# Patient Record
Sex: Male | Born: 1955 | Race: White | Hispanic: No | Marital: Married | Smoking: Never smoker
Health system: Southern US, Community
[De-identification: ages and names within clinical notes are randomized; demographics above are authoritative.]

## PROBLEM LIST (undated history)

## (undated) DIAGNOSIS — E785 Hyperlipidemia, unspecified: Secondary | ICD-10-CM

## (undated) DIAGNOSIS — I10 Essential (primary) hypertension: Secondary | ICD-10-CM

## (undated) HISTORY — PX: OTHER SURGICAL HISTORY: SHX169

---

## 2004-02-08 ENCOUNTER — Observation Stay (HOSPITAL_COMMUNITY): Admission: AD | Admit: 2004-02-08 | Discharge: 2004-02-09 | Payer: Self-pay | Admitting: General Surgery

## 2004-03-27 ENCOUNTER — Ambulatory Visit (HOSPITAL_COMMUNITY): Admission: RE | Admit: 2004-03-27 | Discharge: 2004-03-27 | Payer: Self-pay | Admitting: General Surgery

## 2004-03-27 ENCOUNTER — Ambulatory Visit (HOSPITAL_BASED_OUTPATIENT_CLINIC_OR_DEPARTMENT_OTHER): Admission: RE | Admit: 2004-03-27 | Discharge: 2004-03-27 | Payer: Self-pay | Admitting: General Surgery

## 2008-10-27 ENCOUNTER — Emergency Department (HOSPITAL_COMMUNITY): Admission: EM | Admit: 2008-10-27 | Discharge: 2008-10-27 | Payer: Self-pay | Admitting: Emergency Medicine

## 2010-12-13 NOTE — Op Note (Signed)
NAME:  Richard Schmidt, Richard Schmidt                      ACCOUNT NO.:  [id number]   MEDICAL RECORD NO.:  [id number]                   PATIENT TYPE:  AMB   LOCATION:  DAY                                  FACILITY:  [facility]   PHYSICIAN:  Angelia Mould. Derrell Lolling, M.D.             DATE OF BIRTH:  1956-01-15   DATE OF PROCEDURE:  02/08/2004  DATE OF DISCHARGE:                                 OPERATIVE REPORT   PREOPERATIVE DIAGNOSIS:  Perirectal abscess.   POSTOPERATIVE DIAGNOSIS:  1. Complex perirectal abscess.  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   SURGEON:  Angelia Mould. Derrell Lolling, M.D.   OPERATIVE INDICATIONS:  This is a 55 year old white male who presents with a  10-12 day history of perianal pain and swelling.  He was placed on  antibiotics by his primary care physician, but he did not get better.  He  saw Dr. Orson Slick in the office on July 12th, who performed incision and  drainage of an abscess on the left gluteal area posterior to the rectum and  drained a lot of purulent material.  The patient has been continued on  antibiotics, but he has continued to have pain and fever to 102.  He was  seen today and found to have cellulitis and induration in the right  posterior, left posterior, and left anterior positions and had a white count  of 18,000.  He is brought to the operating room for examination under  anesthesia and drainage of his presumed and completely drained perirectal  abscess.   OPERATIVE TECHNIQUE:  Following induction of general endotracheal  anesthesia, the patient was placed in a dorsal lithotomy position, and the  perianal area was prepped and draped in a sterile fashion.   I first performed rigid proctoscopy.  He had a lot of stool in the rectum,  and I had to wash this out, but ultimately, I was able to proctoscope him up  to about 15 cm.  There was too much stool to go any further.  The rectal  mucosa showed just a little bit of friability, which I thought was  most  likely operative trauma, but I saw no tumors or polyps.   I then perform anoscopy, and I saw an opening in the posterior midline just  above the dentate line in association with a fissure.  I was able to pass a  probe into this from the rectal side.  I then went down to the small opening  in the buttocks on the left posterior side and passed a probe easily through  the abscessed cavity and up into the rectal lumen just above the dentate  line, demonstrating a fistula in ano.   There was some induration and fullness in the right posterior position.  I  made a radially oriented incision here and extended this into the perianal  tissues.  There was some edema fluid, but I really did not find an abscess  there.  There was no communication from the right posterior to the left  posterior position.  I then digitally explored the abscessed cavity from the  incision in the left posterior position and found that there was an  undrained abscess that extended up into the left anterior position.  A great  deal of purulent fluid was removed.  I could pass my finger all the way up  to the left anterior position.  To promote drainage, I made a small radially  oriented incision at the left anterior position so that the two areas  communicated.  This area was washed out with saline.   I placed a rubber band seton around the fistula and divided the skin and  subcutaneous tissue.  I felt that there was too much sphincter mechanism in  between, so I chose to not completely divide this tissue but just left the  rubber band seton in place.  I secured the two ends of the rubber band with  two ties of 2-0 silk.  I also placed a one-half inch Penrose drain in the  left anterior incision and threaded it down to the left posterior position  to promote drainage overnight.  Hemostasis was adequate and achieved with  electrocautery, although there was a little bit of oozing, at the completion  of the case this  did not seem to be much of a problem.  We placed bulky  bandages to collect the drainage and took the patient to the recovery room  in stable condition.   ESTIMATED BLOOD LOSS:  About 50 cc.   COMPLICATIONS:  None.   SPONGE, NEEDLE, INSTRUMENT COUNTS:  Correct.                                               Angelia Mould. Derrell Lolling, M.D.    HMI/MEDQ  D:  02/08/2004  T:  02/08/2004  Job:  [id number]   cc:   [name], M.D.  [address]  [place], [place] [address]  Fax: [phone]

## 2010-12-13 NOTE — Op Note (Signed)
NAME:  Richard Schmidt, Richard Schmidt                      ACCOUNT NO.:  [id number]   MEDICAL RECORD NO.:  [id number]                   PATIENT TYPE:  AMB   LOCATION:  [facility]                                 FACILITY:  [facility]   PHYSICIAN:  Angelia Mould. Derrell Lolling, M.D.             DATE OF BIRTH:  [birth year]   DATE OF PROCEDURE:  03/27/2004  DATE OF DISCHARGE:                                 OPERATIVE REPORT   PREOPERATIVE DIAGNOSIS:  Fistula in ano.   POSTOPERATIVE DIAGNOSIS:  Fistula in ano.   OPERATION PERFORMED:  Anoscopy, second stage repair of fistula in ano with  division of fistula and removal of seton.   SURGEON:  Angelia Mould. Derrell Lolling, M.D.   OPERATIVE INDICATIONS:  This is a 55 year old white man who presented with a  complex perirectal abscess in the left posterior and left anterior position,  but it was not a horseshoe abscess.  He presented over six weeks ago, and I  found that he had a large abscess, which I drained.  I also found that he  had a fistula in the posterior midline, actually quite close to the midline.  This appeared to go transsphincteric, and the anatomy was a little unclear,  so I only partially divided this and placed a rubber band seton.  The  patient has done well.  Pain has resolved.  He still has a little bit of  drainage.  The seton has been tightened up but has not divided through the  fibrotic tissue.  He is brought to the operating room for second-stage  division.   OPERATIVE FINDINGS:  In the posterior midline, the patient had a fistula in  ano with a seton in place.  I divided the fibrotic bridge without much  difficulty and cauterized the edges.  More peripherally, in the left  posterior position, there was an open sinus tract.  I was able to pass a  probe up into this, distal to about 6 cm, and it stayed entirely on the left  hand side of the rectal wall, but there was no communication with the rectal  lumen at all.   OPERATIVE TECHNIQUE:  Following the  induction of general endotracheal  anesthesia, the patient was placed in the dorsal lithotomy position.  The  perianal area was prepped and draped in a sterile fashion.  Marcaine 0.5%  with epinephrine was used as a local infiltration anesthetic.  Inspection  was carried out with findings as described above.  I passed a probe behind  the fistula in the posterior midline and then using the cautery, divided the  fistula.  This appeared to be primarily internal sphincter muscle but could  have contained a little bit of external sphincter.  We cauterized this area  and irrigated it out and placed a piece of Surgicel gauze.  I then inspected  the open sinus tract in the left posterior position more peripherally on the  skin.  There  was a little bit of granulation tissue here, which I cauterized  and debrided.  I passed a small rectal probe, which curved around the  lateral wall of the rectum on the left side and even traveled about 6-7 cm  anteriorly but did not track towards the rectal wall.  I again put the scope  in the rectum or saw no purulence or inflammatory spots, so I did not find  an internal draining sinus here.  On this sinus  tract, I merely curetted it out and irrigated it and placed an external  bandage.  This patient tolerated all of these procedures well and was taken  to the recovery room in stable condition.  Estimated blood loss was about 20  cc.  Complications were none.  Sponge, needle, and instrument counts were  correct.                                               Angelia Mould. Derrell Lolling, M.D.    HMI/MEDQ  D:  03/27/2004  T:  03/27/2004  Job:  [id number]   cc:   [name], M.D.  [address]  [place], [place] [address]  Fax: [phone]

## 2010-12-13 NOTE — H&P (Signed)
NAME:  Richard Schmidt, Richard Schmidt                      ACCOUNT NO.:  [id number]   MEDICAL RECORD NO.:  [id number]                   PATIENT TYPE:  AMB   LOCATION:  DAY                                  FACILITY:  [facility]   PHYSICIAN:  Angelia Mould. Derrell Lolling, M.D.             DATE OF BIRTH:  Jun 19, 1956   DATE OF ADMISSION:  02/08/2004  DATE OF DISCHARGE:                                HISTORY & PHYSICAL   CHIEF COMPLAINT:  Rectal pain and swelling.   HISTORY OF PRESENT ILLNESS:  This is a 55 year old white man who gives a  history of a 10 to 12-day history of progressive perianal pain and swelling.  He saw his primary care physician and was placed on antibiotics.  He did not  get better.  He saw Dr. Orson Slick in the office 48 hours ago and Dr. Orson Slick  performed the incision and drainage on the left side, thinking that he had a  pilonidal abscess.  The patient continued on antibiotics, continued to have  pain and fever up to 102 at home.  He came back to the office today and Dr.  Orson Slick examined him and felt that he possibly had incompletely drained  perirectal abscess.  I was asked to assume his management in the hospital.   The patient does report fever to 102 yesterday. He states he is urinating  okay.  He states his bowel movements have been reasonable and there has been  no rectal bleeding.  He has never had any infection like this before.  He  was admitted for surgery.   PAST MEDICAL HISTORY:  1. Hypertension.  2. Hyperlipidemia.  3. Gastroesophageal reflux disease and hiatal hernia.  4. Perirectal abscess.   He has not had any other surgery or medical problems.   MEDICATIONS:  1. Lisinopril.  2. Lipitor 20 mg daily.   ALLERGIES:  No known drug allergies.   SOCIAL HISTORY:  The patient and his wife live in [place], [place].  They have two children.  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  He drinks alcohol about every three days.  He smokes  an occasional  cigar.   FAMILY HISTORY:  Mother deceased, had cancer of the pancreas, diabetes,  hypertension.  Father deceased of old age, had congestive heart failure.   REVIEW OF SYMPTOMS:  All systems reviewed.  They are noncontributory except  as described above.   PHYSICAL EXAMINATION:  GENERAL APPEARANCE:  A pleasant middle-aged man in  minimal distress.  VITAL SIGNS:  Temperature 100.6, heart rate 98 and regular, respiratory rate  22 and unlabored, blood pressure 121/82.  HEENT:  Eyes: Sclerae are clear.  Extraocular movements intact.  Ears, nose,  mouth and throat:  Nose, lips, tongue and oropharynx are without gross  lesions.  NECK:  Supple, nontender, no mass, no jugular venous distension.  LUNGS:  Clear to auscultation.  No chest wall tenderness.  CARDIOVASCULAR:  Regular rate and rhythm, no murmur.  Radial, femoral and  posterior tibial pulses are palpable.  No peripheral edema.  ABDOMEN:  Flat, soft and nontender.  No palpable mass.  Liver and spleen not  enlarged.  No hernia.  RECTAL:  There is cellulitis and induration in the perianal area, both left  anterior and left posterior.  There is a small recent open wound on the left  posterior position.  There is a slight bit of induration right posterior but  not much and he is completely soft and nontender in the right anterior  position.  He is a little too tender to do a digital rectal examination.   LABORATORY DATA:  White blood cell count 18,100, hemoglobin 13.8.  Potassium  3.0.   ASSESSMENT:  Suspect incompletely drained perirectal abscess.   PLAN:  1. The patient will be taken to the operating room for examination under     anesthesia, proctoscopy and hopefully completion of the drainage of his     abscess.  2. The patient was advised regarding the indication and details of this     surgery.  Risks and complications were outlined, including but not     limited to, bleeding, recurrent infection, sphincter damage with      incontinence, incomplete drainage, and possibility of other diagnosis.     He seems to understand these issues well.  At this time all of his     questions are answered.  He agrees with this plan.                                                Angelia Mould. Derrell Lolling, M.D.    HMI/MEDQ  D:  02/08/2004  T:  02/08/2004  Job:  [id number]   cc:   [name], M.D.  [address]  [place], [place] [address]  Fax: [phone]

## 2011-11-11 ENCOUNTER — Ambulatory Visit
Admission: RE | Admit: 2011-11-11 | Discharge: 2011-11-11 | Disposition: A | Discharge status: Home / Self Care | Payer: BC Managed Care – PPO | Source: Ambulatory Visit | Attending: Family Medicine | Admitting: Family Medicine

## 2011-11-11 ENCOUNTER — Other Ambulatory Visit: Payer: Self-pay | Admitting: Family Medicine

## 2011-11-11 DIAGNOSIS — M25569 Pain in unspecified knee: Secondary | ICD-10-CM

## 2014-09-06 ENCOUNTER — Encounter (HOSPITAL_COMMUNITY): Payer: Self-pay

## 2014-09-06 ENCOUNTER — Emergency Department (HOSPITAL_COMMUNITY)
Admission: EM | Admit: 2014-09-06 | Discharge: 2014-09-07 | Disposition: A | Discharge status: Home / Self Care | Payer: 59 | Attending: Emergency Medicine | Admitting: Emergency Medicine

## 2014-09-06 DIAGNOSIS — I10 Essential (primary) hypertension: Secondary | ICD-10-CM | POA: Insufficient documentation

## 2014-09-06 DIAGNOSIS — R079 Chest pain, unspecified: Secondary | ICD-10-CM | POA: Diagnosis not present

## 2014-09-06 DIAGNOSIS — B349 Viral infection, unspecified: Secondary | ICD-10-CM | POA: Diagnosis not present

## 2014-09-06 DIAGNOSIS — Z8639 Personal history of other endocrine, nutritional and metabolic disease: Secondary | ICD-10-CM | POA: Diagnosis not present

## 2014-09-06 DIAGNOSIS — R42 Dizziness and giddiness: Secondary | ICD-10-CM | POA: Diagnosis present

## 2014-09-06 HISTORY — DX: Hyperlipidemia, unspecified: E78.5

## 2014-09-06 HISTORY — DX: Essential (primary) hypertension: I10

## 2014-09-06 NOTE — ED Notes (Addendum)
Patient reports he had a fever and cough on Monday.  He's had a decreased appetite since then.  He came in tonight due to an episode of dizziness tonight.  He did not receive a flu vaccination this year.  Wife reports he took a dose of Delsym this evening and that's when patient began to feel worse.

## 2014-09-06 NOTE — ED Provider Notes (Signed)
CSN: [id number]     Arrival date & time 09/06/14  2207 History  This chart was scribed for non-physician practitioner Red Mandt A. Hector Shade, PA-C working with Richardean Canal, MD by Conchita Paris, ED Scribe. This patient was seen in [facility] and the patient's care was started at 11:34 PM.   Chief Complaint  Patient presents with  . Dizziness   Patient is a 59 y.o. male presenting with dizziness. The history is provided by the patient. No language interpreter was used.  Dizziness Associated symptoms: chest pain   Associated symptoms: no diarrhea, no nausea, no shortness of breath and no vomiting     HPI Comments: Richard Schmidt is a 59 y.o. male with a history of HTN and HLD who presents to the Emergency Department complaining of what he reports as flu like symptoms. Pt states he first developed a cough and fever. The fever has subsided however the cough has persisted and is intermittent. Pt had one episode of diarrhea yesterday but none since. He reports having no appetite on Monday or Tuesday but this has improved. He has nasal congestion and chest congestion. CP associated with this chest congestion. Pt also complains of a dizzy spell which occurred tonight after standing up from the cough. The dizziness is described as lightheadedness not room spinning. He denies vomiting,  sore throat, rash, urinary symptoms, syncope, SOB, abdominal pain, ear pain and leg swelling. No sick contact.  Past Medical History  Diagnosis Date  . Hypertension   . Hyperlipidemia    Past Surgical History  Procedure Laterality Date  . Rectal abscess     History reviewed. No pertinent family history. History  Substance Use Topics  . Smoking status: Never Smoker   . Smokeless tobacco: Not on file  . Alcohol Use: Yes     Comment: occasionally    Review of Systems  Constitutional: Negative for fever and chills.  HENT: Positive for congestion. Negative for sore throat.   Respiratory: Positive for cough. Negative  for shortness of breath.   Cardiovascular: Positive for chest pain. Negative for leg swelling.  Gastrointestinal: Negative for nausea, vomiting, abdominal pain and diarrhea.  Genitourinary: Negative for dysuria and hematuria.  Skin: Negative for rash.  Neurological: Positive for dizziness and light-headedness. Negative for syncope.    Allergies  Review of patient's allergies indicates no known allergies.  Home Medications   Prior to Admission medications   Not on File   BP 187/84 mmHg  Pulse 97  Temp(Src) 97.8 F (36.6 C) (Axillary)  Resp 20  SpO2 96% Physical Exam  Constitutional: He is oriented to person, place, and time. He appears well-developed and well-nourished. No distress.  HENT:  Head: Normocephalic and atraumatic.  Eyes: Pupils are equal, round, and reactive to light.  Neck: Normal range of motion.  Cardiovascular: Normal rate.   Pulmonary/Chest: Effort normal. No respiratory distress.  Musculoskeletal: Normal range of motion.  Neurological: He is alert and oriented to person, place, and time.  Skin: Skin is warm and dry.  Psychiatric: He has a normal mood and affect. His behavior is normal.  Nursing note and vitals reviewed.   ED Course  Procedures  DIAGNOSTIC STUDIES: Oxygen Saturation is 96% on room air, normal by my interpretation.    COORDINATION OF CARE: 11:41 PM Discussed treatment plan with pt at bedside and pt agreed to plan.   Labs Review Labs Reviewed - No data to display  Imaging Review No results found.   EKG Interpretation None  MDM   Final diagnoses:  None   1. Viral syndrome  No further symptoms of lightheadedness in the ED. Negative orthostasis, EKG unremarkable, CXR clear. VSS. Feel he is stable for discharge with return precautions given.  I personally performed the services described in this documentation, which was scribed in my presence. The recorded information has been reviewed and is accurate.  I personally  performed the services described in this documentation, which was scribed in my presence. The recorded information has been reviewed and is accurate.       Arnoldo HookerShari A Quante Pettry, PA-C 09/07/14 0140  Richardean Canalavid H Yao, MD 09/08/14 [phone]

## 2014-09-07 ENCOUNTER — Emergency Department (HOSPITAL_COMMUNITY): Payer: 59

## 2014-09-07 NOTE — Discharge Instructions (Signed)
Upper Respiratory Infection, Adult An upper respiratory infection (URI) is also sometimes known as the common cold. The upper respiratory tract includes the nose, sinuses, throat, trachea, and bronchi. Bronchi are the airways leading to the lungs. Most people improve within 1 week, but symptoms can last up to 2 weeks. A residual cough may last even longer.  CAUSES Many different viruses can infect the tissues lining the upper respiratory tract. The tissues become irritated and inflamed and often become very moist. Mucus production is also common. A cold is contagious. You can easily spread the virus to others by oral contact. This includes kissing, sharing a glass, coughing, or sneezing. Touching your mouth or nose and then touching a surface, which is then touched by another person, can also spread the virus. SYMPTOMS  Symptoms typically develop 1 to 3 days after you come in contact with a cold virus. Symptoms vary from person to person. They may include:  Runny nose.  Sneezing.  Nasal congestion.  Sinus irritation.  Sore throat.  Loss of voice (laryngitis).  Cough.  Fatigue.  Muscle aches.  Loss of appetite.  Headache.  Low-grade fever. DIAGNOSIS  You might diagnose your own cold based on familiar symptoms, since most people get a cold 2 to 3 times a year. Your caregiver can confirm this based on your exam. Most importantly, your caregiver can check that your symptoms are not due to another disease such as strep throat, sinusitis, pneumonia, asthma, or epiglottitis. Blood tests, throat tests, and X-rays are not necessary to diagnose a common cold, but they may sometimes be helpful in excluding other more serious diseases. Your caregiver will decide if any further tests are required. RISKS AND COMPLICATIONS  You may be at risk for a more severe case of the common cold if you smoke cigarettes, have chronic heart disease (such as heart failure) or lung disease (such as asthma), or if  you have a weakened immune system. The very young and very old are also at risk for more serious infections. Bacterial sinusitis, middle ear infections, and bacterial pneumonia can complicate the common cold. The common cold can worsen asthma and chronic obstructive pulmonary disease (COPD). Sometimes, these complications can require emergency medical care and may be life-threatening. PREVENTION  The best way to protect against getting a cold is to practice good hygiene. Avoid oral or hand contact with people with cold symptoms. Wash your hands often if contact occurs. There is no clear evidence that vitamin C, vitamin E, echinacea, or exercise reduces the chance of developing a cold. However, it is always recommended to get plenty of rest and practice good nutrition. TREATMENT  Treatment is directed at relieving symptoms. There is no cure. Antibiotics are not effective, because the infection is caused by a virus, not by bacteria. Treatment may include:  Increased fluid intake. Sports drinks offer valuable electrolytes, sugars, and fluids.  Breathing heated mist or steam (vaporizer or shower).  Eating chicken soup or other clear broths, and maintaining good nutrition.  Getting plenty of rest.  Using gargles or lozenges for comfort.  Controlling fevers with ibuprofen or acetaminophen as directed by your caregiver.  Increasing usage of your inhaler if you have asthma. Zinc gel and zinc lozenges, taken in the first 24 hours of the common cold, can shorten the duration and lessen the severity of symptoms. Pain medicines may help with fever, muscle aches, and throat pain. A variety of non-prescription medicines are available to treat congestion and runny nose. Your caregiver   can make recommendations and may suggest nasal or lung inhalers for other symptoms.  HOME CARE INSTRUCTIONS   Only take over-the-counter or prescription medicines for pain, discomfort, or fever as directed by your  caregiver.  Use a warm mist humidifier or inhale steam from a shower to increase air moisture. This may keep secretions moist and make it easier to breathe.  Drink enough water and fluids to keep your urine clear or pale yellow.  Rest as needed.  Return to work when your temperature has returned to normal or as your caregiver advises. You may need to stay home longer to avoid infecting others. You can also use a face mask and careful hand washing to prevent spread of the virus. SEEK MEDICAL CARE IF:   After the first few days, you feel you are getting worse rather than better.  You need your caregiver's advice about medicines to control symptoms.  You develop chills, worsening shortness of breath, or brown or red sputum. These may be signs of pneumonia.  You develop yellow or brown nasal discharge or pain in the face, especially when you bend forward. These may be signs of sinusitis.  You develop a fever, swollen neck glands, pain with swallowing, or white areas in the back of your throat. These may be signs of strep throat. SEEK IMMEDIATE MEDICAL CARE IF:   You have a fever.  You develop severe or persistent headache, ear pain, sinus pain, or chest pain.  You develop wheezing, a prolonged cough, cough up blood, or have a change in your usual mucus (if you have chronic lung disease).  You develop sore muscles or a stiff neck. Document Released: 01/07/2001 Document Revised: 10/06/2011 Document Reviewed: 10/19/2013 Exit[employer] Patient Information 2015 [employer]. This information is not intended to replace advice given to you by your health care provider. Make sure you discuss any questions you have with your health care provider.  

## 2016-06-12 ENCOUNTER — Other Ambulatory Visit: Payer: Self-pay | Admitting: Family Medicine

## 2016-06-12 ENCOUNTER — Ambulatory Visit
Admission: RE | Admit: 2016-06-12 | Discharge: 2016-06-12 | Disposition: A | Discharge status: Home / Self Care | Payer: BLUE CROSS/BLUE SHIELD | Source: Ambulatory Visit | Attending: Family Medicine | Admitting: Family Medicine

## 2016-06-12 DIAGNOSIS — M549 Dorsalgia, unspecified: Secondary | ICD-10-CM

## 2018-04-02 IMAGING — CR DG CHEST 2V
2 series · 2 of 2 positions shown · non-contrast
Comparison: PA and lateral chest x-ray June 08, 2015

CLINICAL DATA: Sharp stabbing pain between the shoulder blades and
inferior to the left shoulder blade without known injury. Pain is
aggravated by movement standing or walking. History of hypertension
and hyperlipidemia.

EXAM:
CHEST  2 VIEW

[w chest pa]
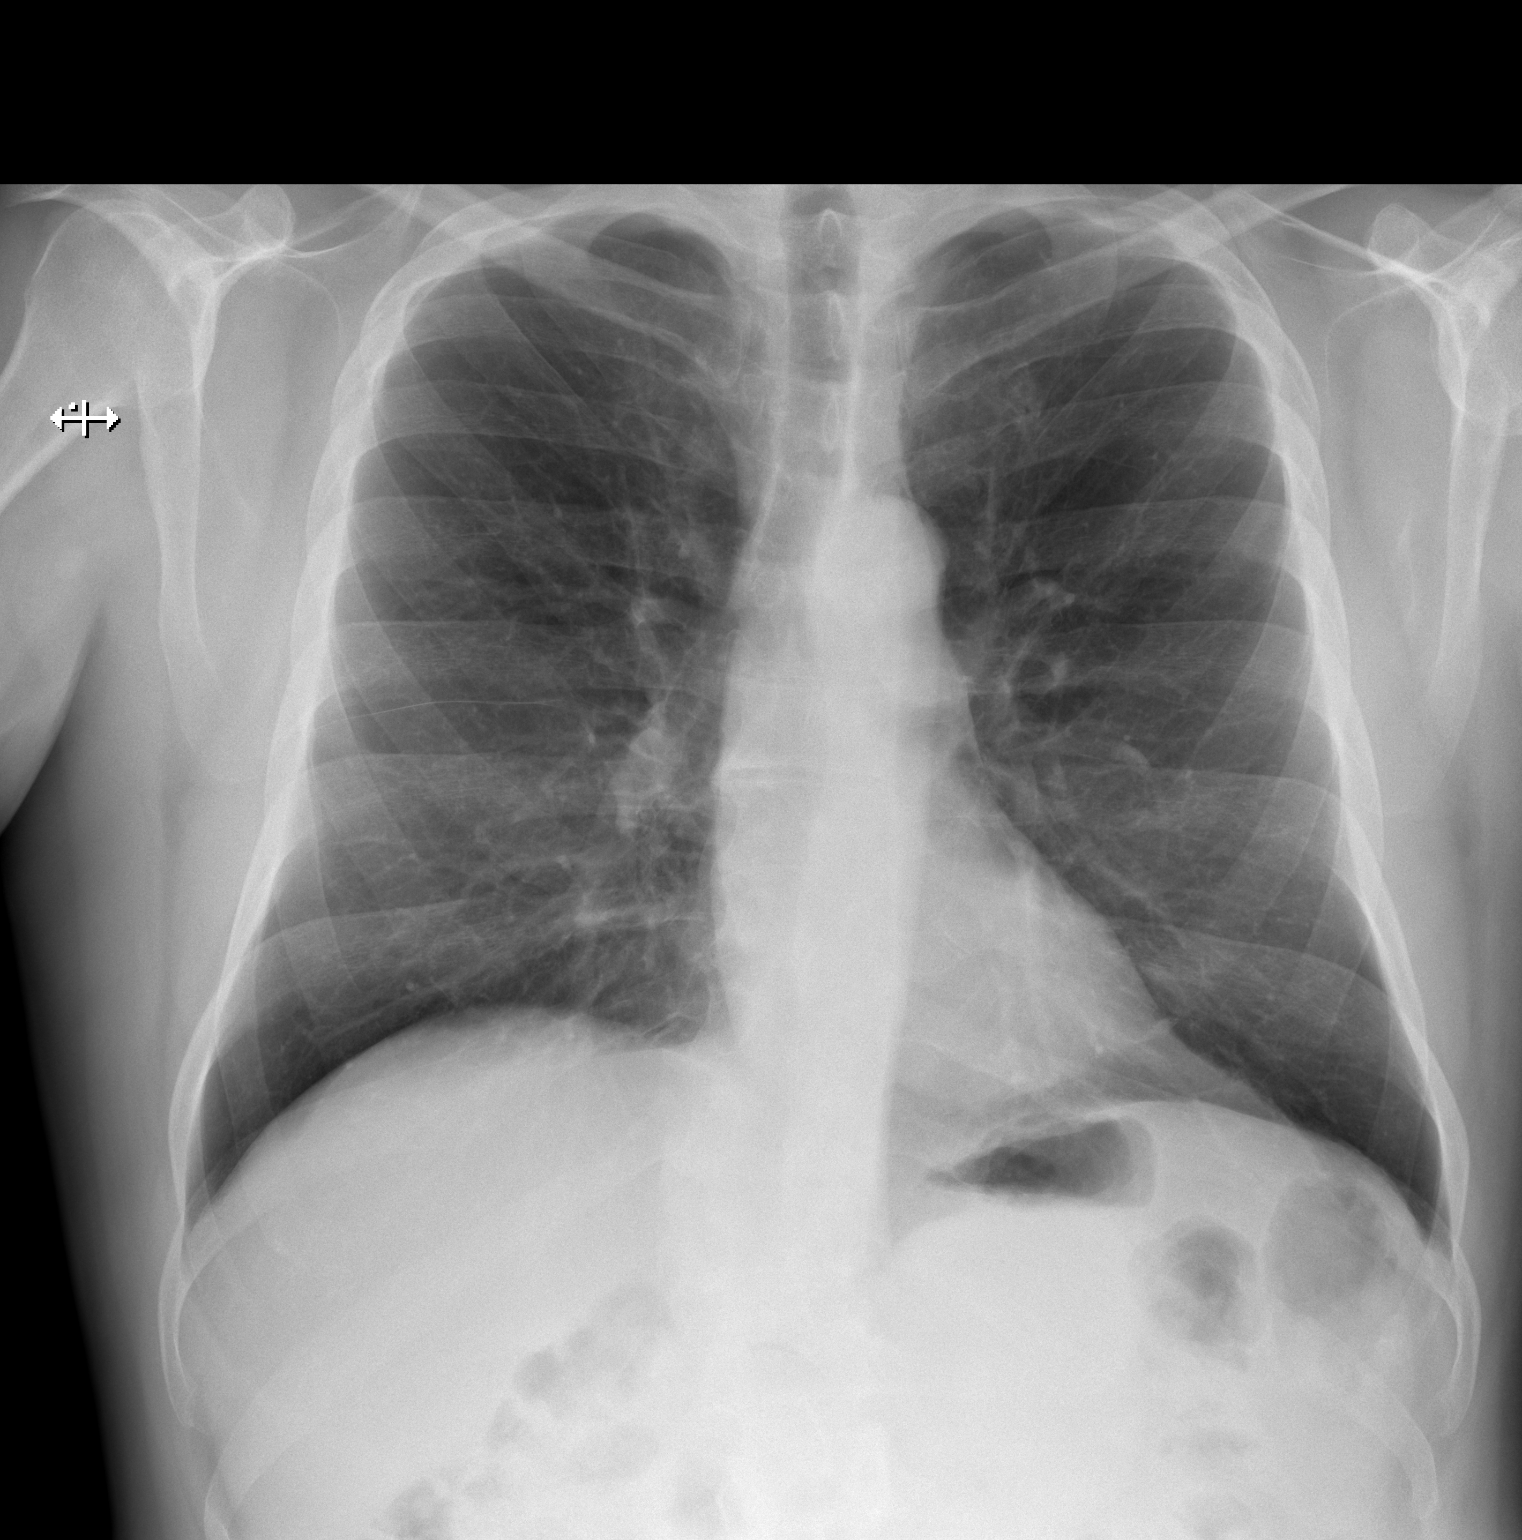

[w chest lat]
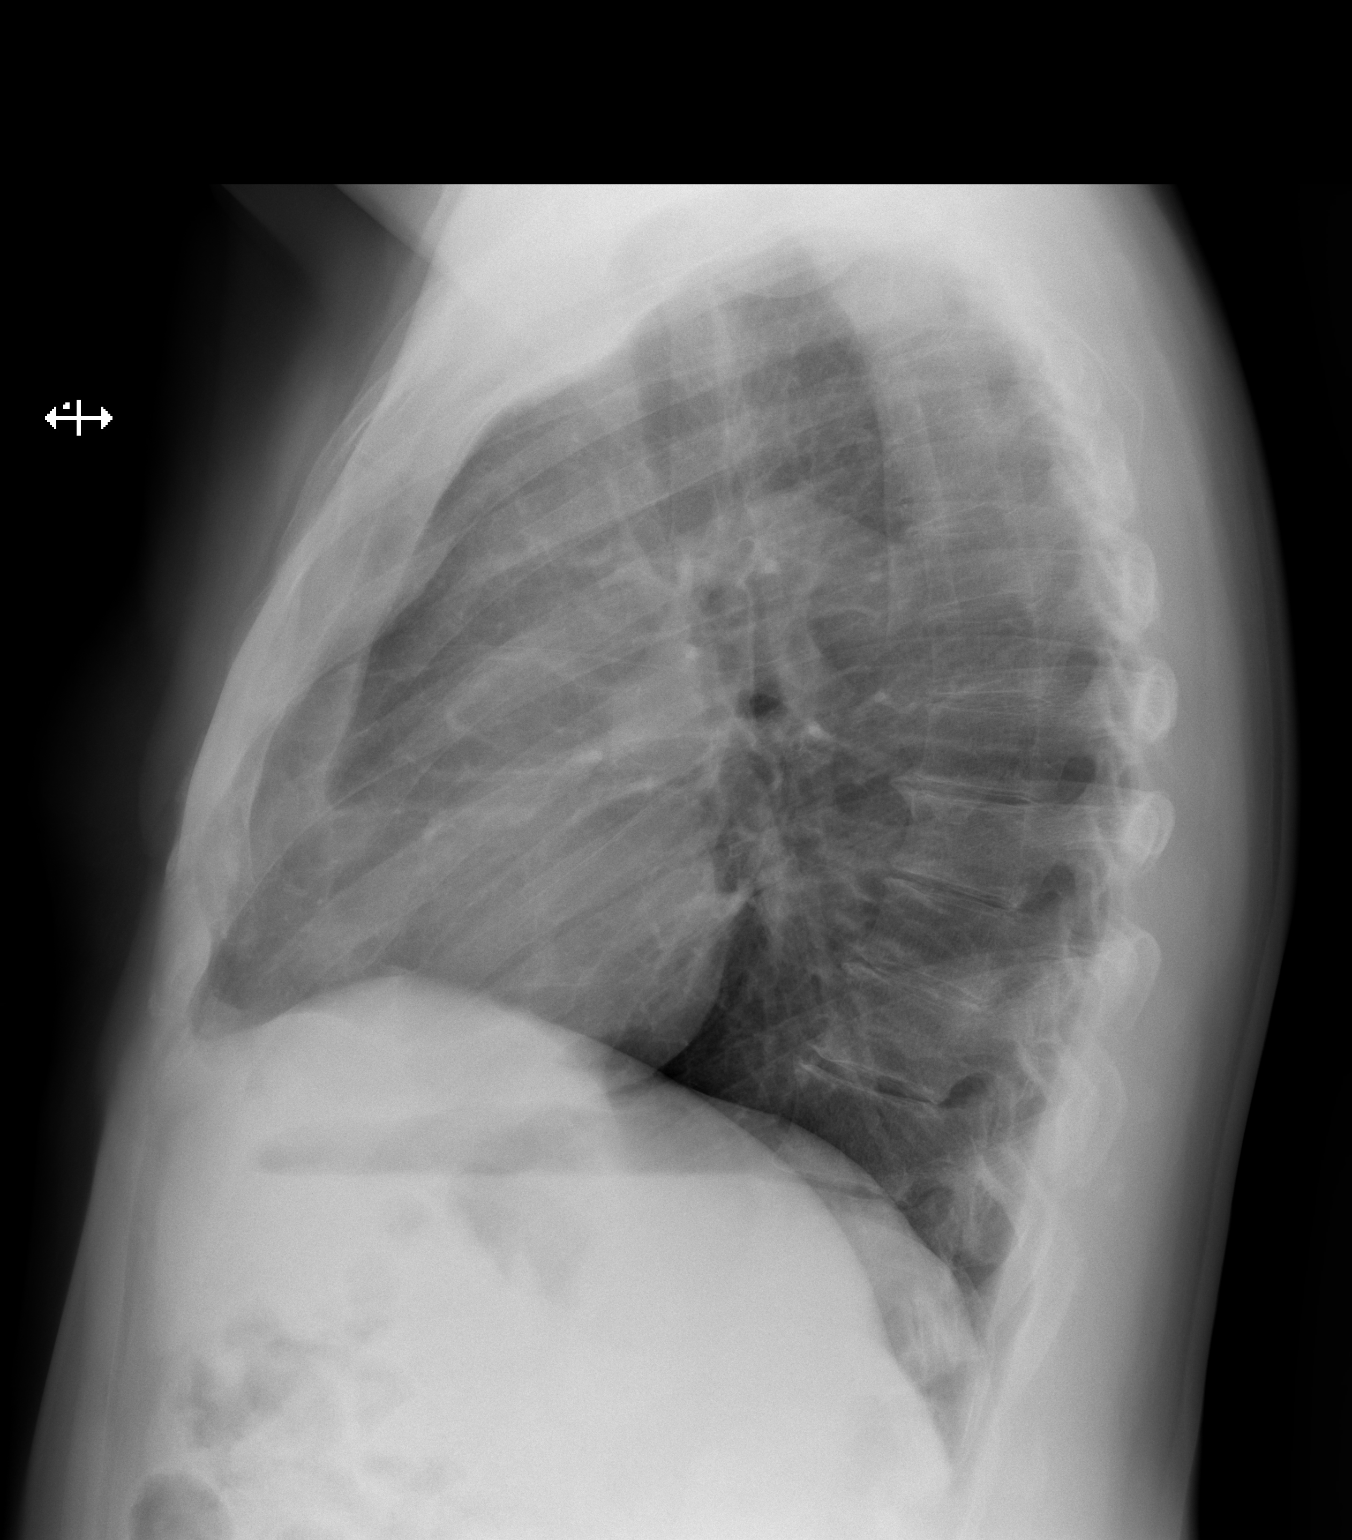

[2 of 2 positions shown; findings below may reference images not displayed]

FINDINGS: The lungs are adequately inflated. There is no focal infiltrate.
There is no pleural effusion or pneumothorax. The heart and
pulmonary vascularity are normal. The mediastinum is normal in
width. There is no pleural effusion. The bony thorax is
unremarkable. The gas pattern in the upper abdomen is normal.
IMPRESSION: There is no active cardiopulmonary disease.
